# Patient Record
Sex: Female | Born: 2005 | Race: White | Marital: Single | Smoking: Never smoker
Health system: Northeastern US, Academic
[De-identification: ages and names within clinical notes are randomized; demographics above are authoritative.]

## PROBLEM LIST (undated history)

## (undated) DIAGNOSIS — F419 Anxiety disorder, unspecified: Secondary | ICD-10-CM

## (undated) DIAGNOSIS — F32A Depression, unspecified: Secondary | ICD-10-CM

## (undated) DIAGNOSIS — J45909 Unspecified asthma, uncomplicated: Secondary | ICD-10-CM

## (undated) DIAGNOSIS — T7492XA Unspecified child maltreatment, confirmed, initial encounter: Secondary | ICD-10-CM

## (undated) DIAGNOSIS — Z9109 Other allergy status, other than to drugs and biological substances: Secondary | ICD-10-CM

## (undated) DIAGNOSIS — F329 Major depressive disorder, single episode, unspecified: Secondary | ICD-10-CM

## (undated) DIAGNOSIS — A4902 Methicillin resistant Staphylococcus aureus infection, unspecified site: Secondary | ICD-10-CM

## (undated) DIAGNOSIS — T50901A Poisoning by unspecified drugs, medicaments and biological substances, accidental (unintentional), initial encounter: Secondary | ICD-10-CM

## (undated) DIAGNOSIS — J020 Streptococcal pharyngitis: Secondary | ICD-10-CM

## (undated) DIAGNOSIS — L309 Dermatitis, unspecified: Secondary | ICD-10-CM

## (undated) DIAGNOSIS — J069 Acute upper respiratory infection, unspecified: Secondary | ICD-10-CM

## (undated) HISTORY — DX: Depression, unspecified: F32.A

## (undated) HISTORY — DX: Unspecified asthma, uncomplicated: J45.909

## (undated) HISTORY — PX: ADENOIDECTOMY: SUR15

## (undated) HISTORY — DX: Acute upper respiratory infection, unspecified: J06.9

## (undated) HISTORY — DX: Dermatitis, unspecified: L30.9

---
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
## 2024-03-27 ENCOUNTER — Other Ambulatory Visit: Payer: Self-pay

## 2024-03-27 ENCOUNTER — Other Ambulatory Visit
Admission: RE | Admit: 2024-03-27 | Discharge: 2024-03-27 | Disposition: A | Discharge status: Home / Self Care | Source: Ambulatory Visit

## 2024-03-27 DIAGNOSIS — R0602 Shortness of breath: Secondary | ICD-10-CM | POA: Insufficient documentation

## 2024-03-28 LAB — MYCOPLASMA PNEUMONIAE NAAT: Mycoplasma pneumoniae NAAT: 0

## 8253-03-14 DEATH — deceased
